# Patient Record
Sex: Male | Born: 1980 | Race: White | Hispanic: No | Marital: Single | Smoking: Never smoker
Health system: Southern US, Community
[De-identification: ages and names within clinical notes are randomized; demographics above are authoritative.]

---

## 2014-12-24 ENCOUNTER — Encounter (HOSPITAL_COMMUNITY): Payer: Self-pay

## 2014-12-24 ENCOUNTER — Emergency Department (HOSPITAL_COMMUNITY)
Admission: EM | Admit: 2014-12-24 | Discharge: 2014-12-24 | Disposition: A | Discharge status: Home / Self Care | Payer: Self-pay | Attending: Emergency Medicine | Admitting: Emergency Medicine

## 2014-12-24 ENCOUNTER — Emergency Department (HOSPITAL_COMMUNITY): Payer: Self-pay

## 2014-12-24 DIAGNOSIS — R6883 Chills (without fever): Secondary | ICD-10-CM | POA: Insufficient documentation

## 2014-12-24 DIAGNOSIS — R6884 Jaw pain: Secondary | ICD-10-CM | POA: Insufficient documentation

## 2014-12-24 DIAGNOSIS — R110 Nausea: Secondary | ICD-10-CM | POA: Insufficient documentation

## 2014-12-24 DIAGNOSIS — R0602 Shortness of breath: Secondary | ICD-10-CM | POA: Insufficient documentation

## 2014-12-24 DIAGNOSIS — M5412 Radiculopathy, cervical region: Secondary | ICD-10-CM | POA: Insufficient documentation

## 2014-12-24 DIAGNOSIS — R200 Anesthesia of skin: Secondary | ICD-10-CM | POA: Insufficient documentation

## 2014-12-24 LAB — CBC WITH DIFFERENTIAL/PLATELET
Basophils Absolute: 0 10*3/uL (ref 0.0–0.1)
Basophils Relative: 1 %
EOS PCT: 4 %
Eosinophils Absolute: 0.2 10*3/uL (ref 0.0–0.7)
HCT: 44.7 % (ref 39.0–52.0)
HEMOGLOBIN: 14.9 g/dL (ref 13.0–17.0)
LYMPHS ABS: 1.9 10*3/uL (ref 0.7–4.0)
LYMPHS PCT: 31 %
MCH: 30.1 pg (ref 26.0–34.0)
MCHC: 33.3 g/dL (ref 30.0–36.0)
MCV: 90.3 fL (ref 78.0–100.0)
Monocytes Absolute: 0.4 10*3/uL (ref 0.1–1.0)
Monocytes Relative: 6 %
NEUTROS PCT: 58 %
Neutro Abs: 3.6 10*3/uL (ref 1.7–7.7)
Platelets: 277 10*3/uL (ref 150–400)
RBC: 4.95 MIL/uL (ref 4.22–5.81)
RDW: 13.1 % (ref 11.5–15.5)
WBC: 6.1 10*3/uL (ref 4.0–10.5)

## 2014-12-24 LAB — BASIC METABOLIC PANEL
Anion gap: 10 (ref 5–15)
BUN: 13 mg/dL (ref 6–20)
CHLORIDE: 101 mmol/L (ref 101–111)
CO2: 27 mmol/L (ref 22–32)
Calcium: 9.5 mg/dL (ref 8.9–10.3)
Creatinine, Ser: 1.2 mg/dL (ref 0.61–1.24)
GFR calc Af Amer: >60 mL/min (ref >60)
GFR calc non Af Amer: >60 mL/min (ref >60)
GLUCOSE: 130 mg/dL — AB (ref 65–99)
POTASSIUM: 3.6 mmol/L (ref 3.5–5.1)
Sodium: 138 mmol/L (ref 135–145)

## 2014-12-24 LAB — I-STAT TROPONIN, ED: Troponin i, poc: 0 ng/mL (ref 0.00–0.08)

## 2014-12-24 NOTE — ED Provider Notes (Signed)
CSN: [id number]     Arrival date & time 12/24/14  1252 History  By signing my name below, I, Placido SouLogan Joldersma, attest that this documentation has been prepared under the direction and in the presence of [employer] Janequa Kipnis, PA-C. Electronically Signed: Placido SouLogan Joldersma, ED Scribe. 12/24/2014. 1:06 PM.   Chief Complaint  Patient presents with  . Neck Pain  . Jaw Pain  . Numbness  . Chills   The history is provided by the patient. No language interpreter was used.    HPI Comments: Timothy Decker is a 34 y.o. male sent from urgent care for valuation of constant, 2/10, low, right lateral, neck pain with onset 1 hour ago. Since onset of his neck pain, he now notes associated nausea, chills, rapid heart beat without CP, right jaw pain, mild SOB and left arm numbness that he describes as "not as responsive as normal" but confirms having sensation in the affected arm. Denies weakness. [facility] Asp[facility] and based on his symptoms was sent to the ED for further evaluation. He denies any prior medical hx and further notes that he's currently taking Propecia but denies any other regular medications. He denies any hx of smoking or fmx including MI or CVA. Pt denies any illegal narcotic use or recent travels. He denies CP, issues ambulating, cough, leg swelling, calf pain and fever.    No past medical history on file. No past surgical history on file. No family history on file. Social History  Substance Use Topics  . Smoking status: Not on file  . Smokeless tobacco: Not on file  . Alcohol Use: Not on file    Review of Systems A complete 10 system review of systems was obtained and all systems are negative except as noted in the HPI and PMH.  Allergies  Review of patient's allergies indicates not on file.  Home Medications   Prior to Admission medications   Not on File   BP 134/80 mmHg  Pulse 71  Temp(Src) 97.4 F (36.3 C) (Oral)  Resp 16  Ht 5\' 10"   (1.778 m)  Wt 130 lb (58.968 kg)  BMI 18.65 kg/m2  SpO2 100% Physical Exam  Constitutional: He is oriented to person, place, and time. He appears well-developed and well-nourished. No distress.  HENT:  Head: Normocephalic and atraumatic.  Mouth/Throat: Oropharynx is clear and moist. No oropharyngeal exudate.  Eyes: Conjunctivae and EOM are normal. Pupils are equal, round, and reactive to light.  No TTP of maxillary or frontal sinuses  No TTP or induration of temporal arteries bilaterally  Neck: Normal range of motion. Neck supple. No JVD present. No tracheal deviation present.  FROM to C-spine. Pt can touch chin to chest without discomfort. No TTP of midline cervical spine.   Cardiovascular: Normal rate, regular rhythm and intact distal pulses.   Radial pulse equal bilaterally  Pulmonary/Chest: Effort normal and breath sounds normal. No stridor. No respiratory distress. He has no wheezes. He has no rales. He exhibits no tenderness.  Abdominal: Soft. Bowel sounds are normal. He exhibits no distension and no mass. There is no tenderness. There is no rebound and no guarding.  Musculoskeletal: Normal range of motion. He exhibits no edema or tenderness.  No calf asymmetry, superficial collaterals, palpable cords, edema, Homans sign negative bilaterally.    Neurological: He is alert and oriented to person, place, and time. No cranial nerve deficit.  II-Visual fields grossly intact. III/IV/VI-Extraocular movements intact.  Pupils reactive bilaterally.  V/VII-Smile symmetric, equal eyebrow raise,  facial sensation intact VIII- Hearing grossly intact IX/X-Normal gag XI-bilateral shoulder shrug XII-midline tongue extension Motor: 5/5 bilaterally with normal tone and bulk Cerebellar: Normal finger-to-nose  and normal heel-to-shin test.   Romberg negative Ambulates with a coordinated gait   Skin: Skin is warm and dry. He is not diaphoretic.  Psychiatric: He has a normal mood and affect. His  behavior is normal.  Nursing note and vitals reviewed.   ED Course  Procedures  DIAGNOSTIC STUDIES: Oxygen Saturation is 100% on RA, normal by my interpretation.    COORDINATION OF CARE: 1:01 PM Pt presents today due to neck pain, nausea, chills and left arm numbness. Discussed next steps for treatment with pt at bedside. Pt agreed to plan.  Labs Review Labs Reviewed - No data to display  Imaging Review No results found. I have personally reviewed and evaluated these images and lab results as part of my medical decision-making.   EKG Interpretation  Date/Time:  Wednesday December 24 2014 13:11:37 EDT Ventricular Rate:  72 PR Interval:  173 QRS Duration: 103 QT Interval:  402 QTC Calculation: 440 R Axis:   91 Text Interpretation:  Sinus rhythm Biatrial enlargement Borderline right  axis deviation RSR' in V1 or V2, probably normal variant ST elev, probable  normal early repol pattern No previous tracing Confirmed by KNAPP  MD-J,  JON ([id number]) on 12/24/2014 1:17:00 PM    MDM   Final diagnoses:  Cervical radiculopathy    Filed Vitals:   12/24/14 1300 12/24/14 1501  BP: 134/80 95/61  Pulse: 71 67  Temp: 97.4 F (36.3 C) 98.2 F (36.8 C)  TempSrc: Oral Oral  Resp: 16 16  Height:  (1.778 m)   Weight: 130 lb (58.968 kg)   SpO2: 100% 99%    Timothy Decker is 34 y.o. male presenting with multiple complaints including right lateral neck pain, right jaw pain and what he describes as left arm numbness however his sensation and strength are intact. Patient has no midline C-spine tenderness, no meningeal signs. He reports slight shortness of breath and chills. Physical exam with no abnormality, his neuro exam is nonfocal. Patient is otherwise healthy with very few risk factors for stroke or cardiac issues.  Pt is low risk by Wells Criteria and PERC negative. Patient is taking Propecia, I have called the pharmacist Wonda Olds who confirms that Propecia should not affect  coagulability. Patient is low risk by heart score. EKG with no acute changes, troponin negative.  Evaluation does not show pathology that would require ongoing emergent intervention or inpatient treatment. Pt is hemodynamically stable and mentating appropriately. Discussed findings and plan with patient/guardian, who agrees with care plan. All questions answered. Return precautions discussed and outpatient follow up given.   I personally performed the services described in this documentation, which was scribed in my presence. The recorded information has been reviewed and is accurate.   Wynetta Emery, PA-C 12/24/14 1520  Linwood Dibbles, MD 12/25/14 [phone]

## 2014-12-24 NOTE — Discharge Instructions (Signed)
For pain control please take ibuprofen (also known as Motrin or Advil) 800mg (this is normally 4 over the counter pills) 3 times a day  for 5 days. Take with food to minimize stomach irritation. ° ° °Please follow with your primary care doctor in the next 2 days for a check-up. They must obtain records for further management.  ° °Do not hesitate to return to the Emergency Department for any new, worsening or concerning symptoms.  ° ° °Cervical Radiculopathy °Cervical radiculopathy happens when a nerve in the neck (cervical nerve) is pinched or bruised. This condition can develop because of an injury or as part of the normal aging process. Pressure on the cervical nerves can cause pain or numbness that runs from the neck all the way down into the arm and fingers. Usually, this condition gets better with rest. Treatment may be needed if the condition does not improve.  °CAUSES °This condition may be caused by: °· Injury. °· Slipped (herniated) disk. °· Muscle tightness in the neck because of overuse. °· Arthritis. °· Breakdown or degeneration in the bones and joints of the spine (spondylosis) due to aging. °· Bone spurs that may develop near the cervical nerves. °SYMPTOMS °Symptoms of this condition include: °· Pain that runs from the neck to the arm and hand. The pain can be severe or irritating. It may be worse when the neck is moved. °· Numbness or weakness in the affected arm and hand. °DIAGNOSIS °This condition may be diagnosed based on symptoms, medical history, and a physical exam. You may also have tests, including: °· X-rays. °· CT scan. °· MRI. °· Electromyogram (EMG). °· Nerve conduction tests. °TREATMENT °In many cases, treatment is not needed for this condition. With rest, the condition usually gets better over time. If treatment is needed, options may include: °· Wearing a soft neck collar for short periods of time. °· Physical therapy to strengthen your neck muscles. °· Medicines, such as NSAIDs, oral  corticosteroids, or spinal injections. °· Surgery. This may be needed if other treatments do not help. Various types of surgery may be done depending on the cause of your problems. °HOME CARE INSTRUCTIONS °Managing Pain °· Take over-the-counter and prescription medicines only as told by your health care provider. °· If directed, apply ice to the affected area. °¨ Put ice in a plastic bag. °¨ Place a towel between your skin and the bag. °¨ Leave the ice on for 20 minutes, 2-3 times per day. °· If ice does not help, you can try using heat. Take a warm shower or warm bath, or use a heat pack as told by your health care provider. °· Try a gentle neck and shoulder massage to help relieve symptoms. °Activity °· Rest as needed. Follow instructions from your health care provider about any restrictions on activities. °· Do stretching and strengthening exercises as told by your health care provider or physical therapist. °General Instructions °· If you were given a soft collar, wear it as told by your health care provider. °· Use a flat pillow when you sleep. °· Keep all follow-up visits as told by your health care provider. This is important. °SEEK MEDICAL CARE IF: °· Your condition does not improve with treatment. °SEEK IMMEDIATE MEDICAL CARE IF: °· Your pain gets much worse and cannot be controlled with medicines. °· You have weakness or numbness in your hand, arm, face, or leg. °· You have a high fever. °· You have a stiff, rigid neck. °· You lose   control of your bowels or your bladder (have incontinence). °· You have trouble with walking, balance, or speaking. °  °This information is not intended to replace advice given to you by your health care provider. Make sure you discuss any questions you have with your health care provider. °  °Document Released: 11/09/2000 Document Revised: 11/05/2014 Document Reviewed: 04/10/2014 °Elsevier Interactive Patient Education ©2016 [employer] ° °

## 2014-12-24 NOTE — ED Notes (Signed)
Patient c/o right lateral neck pain, right jaw pain, left arm numbness, slight SOB, and chills about 1 hour ago. Patient states he was seen at an [facility] and was given 3 Aspirin approx 10 minutes ago.

## 2016-05-15 IMAGING — CR DG CHEST 2V
2 series · 2 of 2 positions shown · non-contrast
Comparison: None.

CLINICAL DATA: Shortness of breath with left arm numbness

EXAM:
CHEST  2 VIEW

[w chest pa]
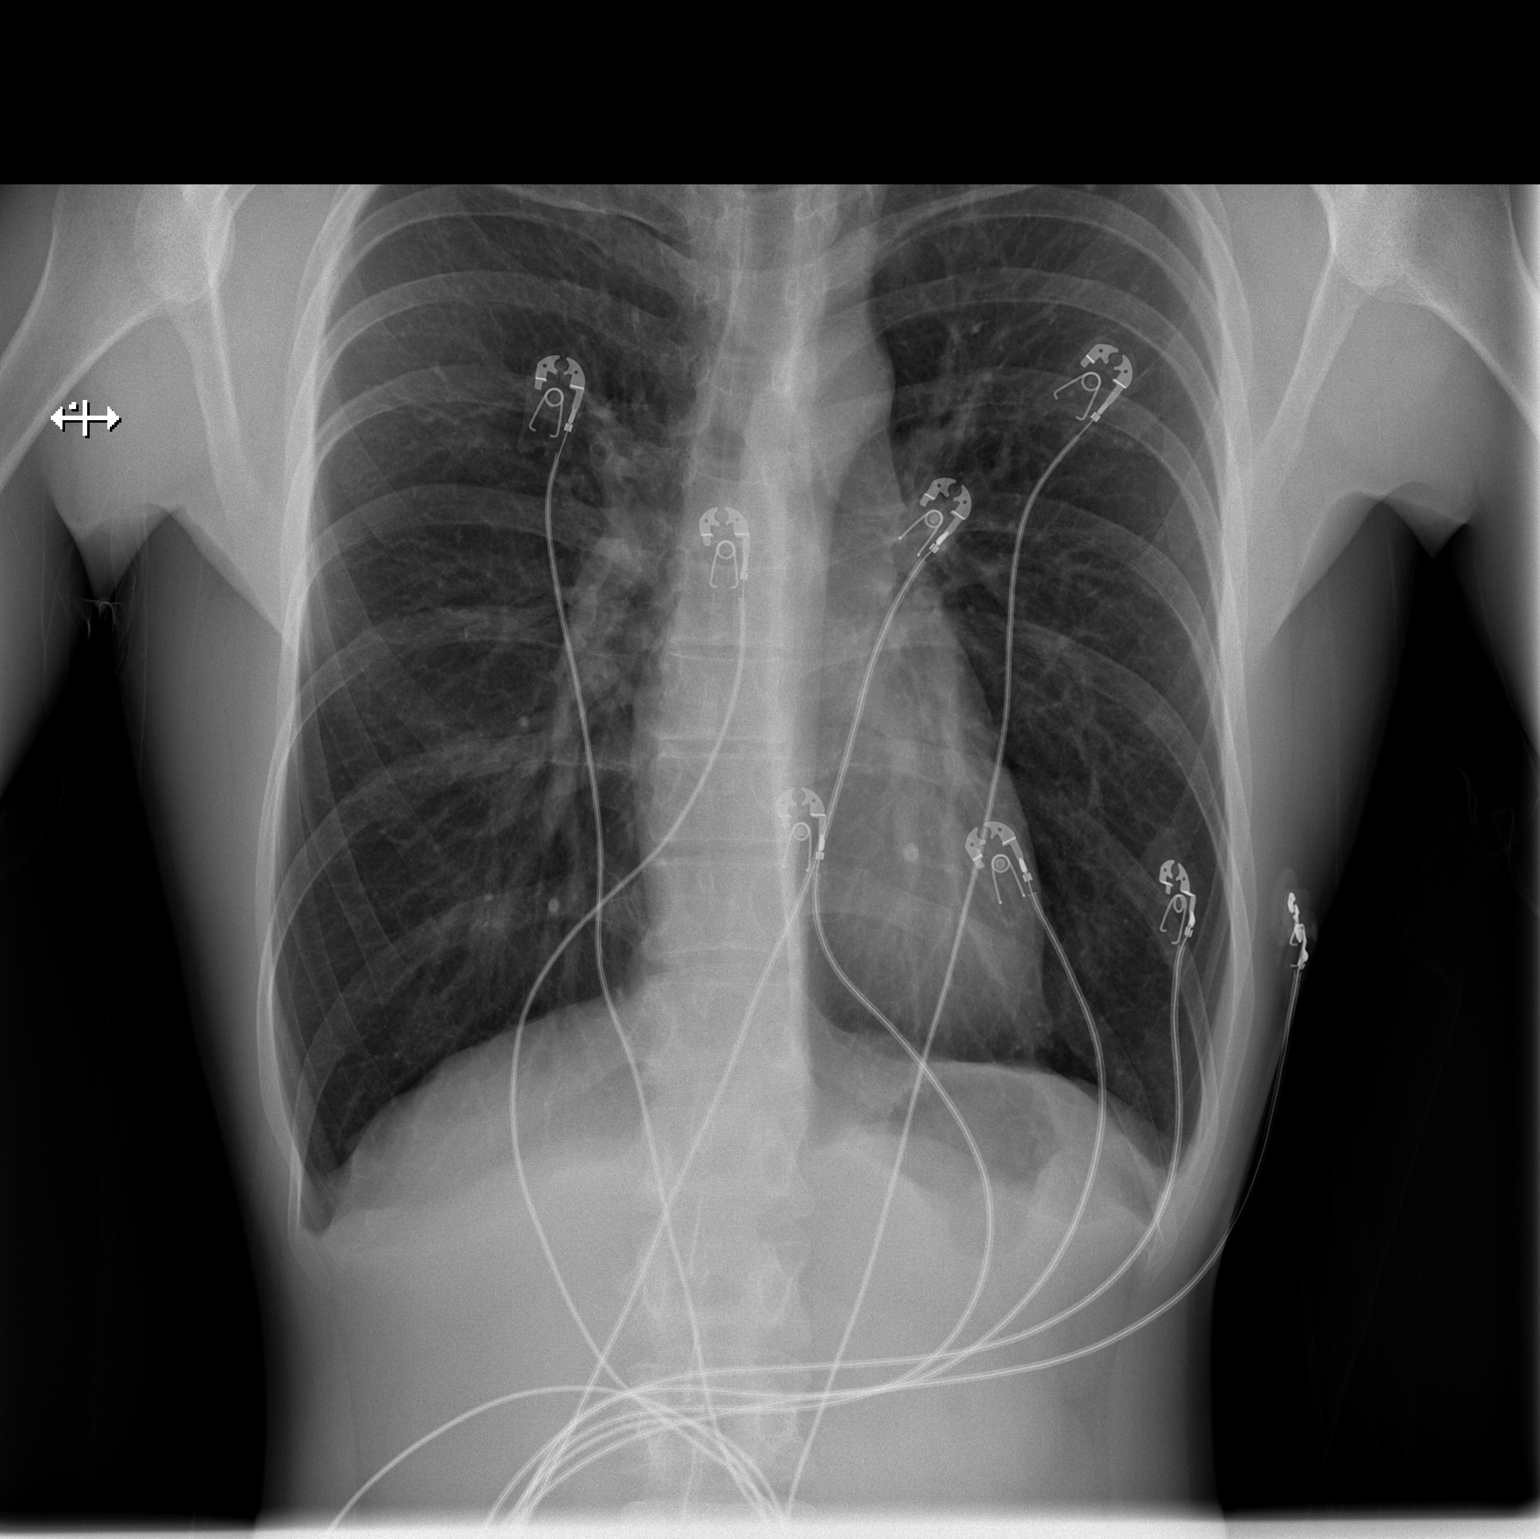

[w chest lat]
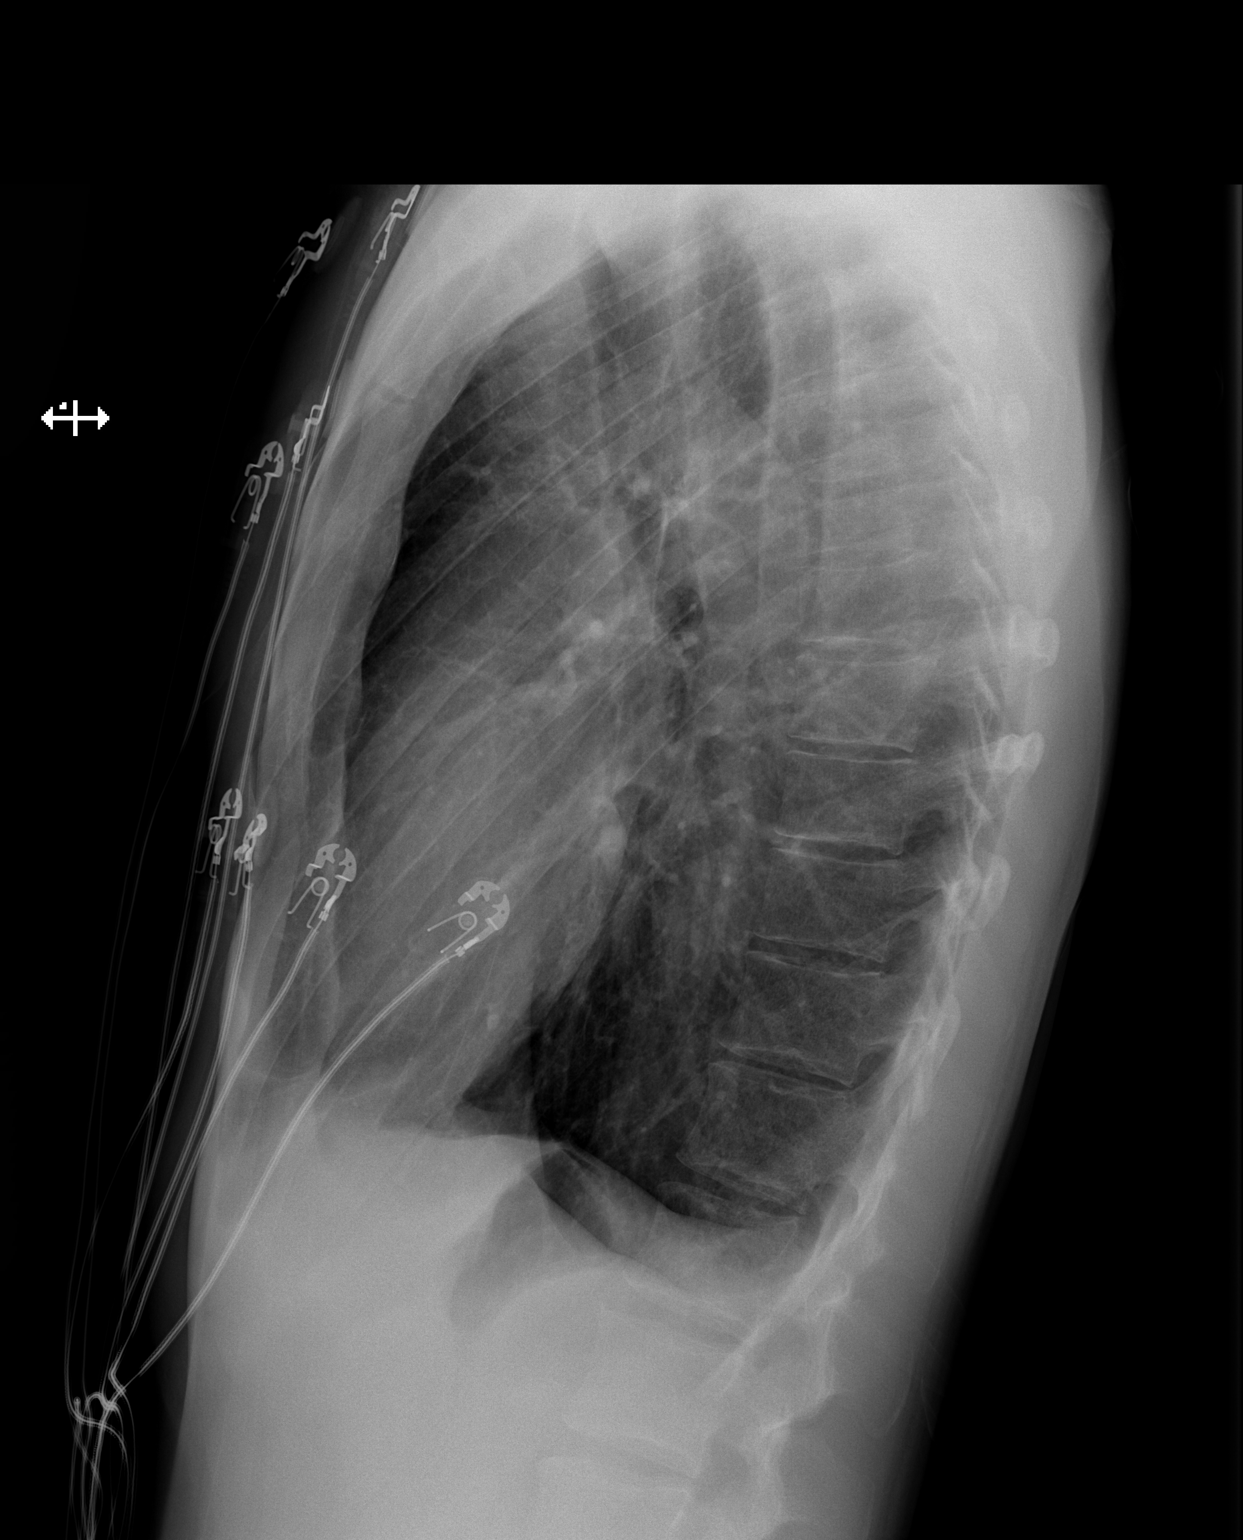

[2 of 2 positions shown; findings below may reference images not displayed]

FINDINGS: The lungs are slightly hyperexpanded. There is a tiny granuloma in
the left apex. There is no edema or consolidation. Heart size and
pulmonary vascularity are normal. No adenopathy. No bone lesions.
IMPRESSION: No edema or consolidation.  Tiny granuloma left apex.
